# Patient Record
Sex: Female | Born: 1960 | Hispanic: No | Marital: Married
Health system: Southern US, Community
[De-identification: ages and names within clinical notes are randomized; demographics above are authoritative.]

---

## 2011-05-15 ENCOUNTER — Emergency Department (HOSPITAL_COMMUNITY): Payer: Managed Care, Other (non HMO)

## 2011-05-15 ENCOUNTER — Emergency Department (HOSPITAL_COMMUNITY)
Admission: EM | Admit: 2011-05-15 | Discharge: 2011-05-15 | Disposition: A | Discharge status: Home / Self Care | Payer: Managed Care, Other (non HMO) | Attending: Emergency Medicine | Admitting: Emergency Medicine

## 2011-05-15 ENCOUNTER — Encounter (HOSPITAL_COMMUNITY): Payer: Self-pay | Admitting: Emergency Medicine

## 2011-05-15 DIAGNOSIS — R109 Unspecified abdominal pain: Secondary | ICD-10-CM | POA: Insufficient documentation

## 2011-05-15 DIAGNOSIS — R10814 Left lower quadrant abdominal tenderness: Secondary | ICD-10-CM | POA: Insufficient documentation

## 2011-05-15 LAB — URINALYSIS, ROUTINE W REFLEX MICROSCOPIC
Glucose, UA: NEGATIVE mg/dL
Nitrite: NEGATIVE
Specific Gravity, Urine: 1.022 (ref 1.005–1.030)
pH: 5 (ref 5.0–8.0)

## 2011-05-15 LAB — URINE MICROSCOPIC-ADD ON

## 2011-05-15 MED ORDER — HYDROCODONE-ACETAMINOPHEN 5-500 MG PO TABS: 1.0000 | ORAL_TABLET | Freq: Four times a day (QID) | ORAL | Status: AC | PRN

## 2011-05-15 MED ORDER — ONDANSETRON HCL 4 MG/2ML IJ SOLN
4.0000 mg | Freq: Once | INTRAMUSCULAR | Status: AC
  Administered 2011-05-15: 4 mg via INTRAVENOUS
  Filled 2011-05-15: qty 2

## 2011-05-15 MED ORDER — HYDROMORPHONE HCL PF 1 MG/ML IJ SOLN
1.0000 mg | Freq: Once | INTRAMUSCULAR | Status: AC
  Administered 2011-05-15: 1 mg via INTRAVENOUS
  Filled 2011-05-15: qty 1

## 2011-05-15 MED ORDER — OXYCODONE-ACETAMINOPHEN 5-325 MG PO TABS: 1.0000 | ORAL_TABLET | ORAL | Status: DC | PRN

## 2011-05-15 MED ORDER — KETOROLAC TROMETHAMINE 30 MG/ML IJ SOLN
30.0000 mg | Freq: Once | INTRAMUSCULAR | Status: AC
  Administered 2011-05-15: 30 mg via INTRAVENOUS
  Filled 2011-05-15: qty 1

## 2011-05-15 MED ORDER — SODIUM CHLORIDE 0.9 % IV BOLUS (SEPSIS)
1000.0000 mL | Freq: Once | INTRAVENOUS | Status: AC
  Administered 2011-05-15: 1000 mL via INTRAVENOUS

## 2011-05-15 MED ORDER — PROMETHAZINE HCL 25 MG PO TABS: 25.0000 mg | ORAL_TABLET | Freq: Four times a day (QID) | ORAL | Status: AC | PRN

## 2011-05-15 NOTE — ED Notes (Signed)
Pt states pain is starting to come back. md notified.

## 2011-05-15 NOTE — ED Provider Notes (Signed)
History     CSN: [id number]  Arrival date & time 05/15/11  0125   First MD Initiated Contact with Patient 05/15/11 0131      Chief Complaint  Patient presents with  . Abdominal Pain    (Consider location/radiation/quality/duration/timing/severity/associated sxs/prior treatment) Patient is a 51 y.o. female presenting with abdominal pain. The history is provided by the patient.  Abdominal Pain The primary symptoms of the illness include abdominal pain.   patient reports development of acute onset left lower quadrant abdominal pain this evening.  She denies either.  She denies vomiting.  She denies diarrhea.  She denies melena or hematochezia.  She reports she feels like the pain is coming from her left ovary.  She does have a history of cysts.  She's recently been evaluated by the OB/GYN for dysfunctional uterine bleeding and is currently on OCPs.  She denies fevers and chills.  She reports approximately 12-24 hours of urinary frequency without dysuria.  She denies hematuria.  She reports difficulty finding a comfortable position.  She does report the pain radiates down her left thigh as well.  History reviewed. No pertinent past medical history.  History reviewed. No pertinent past surgical history.  History reviewed. No pertinent family history.  History  Substance Use Topics  . Smoking status: Not on file  . Smokeless tobacco: Not on file  . Alcohol Use: Not on file    OB History    Grav Para Term Preterm Abortions TAB SAB Ect Mult Living                  Review of Systems  Gastrointestinal: Positive for abdominal pain.  All other systems reviewed and are negative.    Allergies  Review of patient's allergies indicates not on file.  Home Medications  No current outpatient prescriptions on file.  BP 112/53  Pulse 73  Temp(Src) 98 F (36.7 C) (Oral)  Resp 18  SpO2 99%  Physical Exam  Nursing note and vitals reviewed. Constitutional: She is oriented to person,  place, and time. She appears well-developed and well-nourished. No distress.       Uncomfortable appearing  HENT:  Head: Normocephalic and atraumatic.  Eyes: EOM are normal.  Neck: Normal range of motion.  Cardiovascular: Normal rate, regular rhythm and normal heart sounds.   Pulmonary/Chest: Effort normal and breath sounds normal.  Abdominal: Soft. She exhibits no distension.       Mild tenderness in LLQ without guarding or rebound  Musculoskeletal: Normal range of motion.  Neurological: She is alert and oriented to person, place, and time.  Skin: Skin is warm and dry.  Psychiatric: She has a normal mood and affect. Judgment normal.    ED Course  Procedures (including critical care time)  Labs Reviewed  URINALYSIS, ROUTINE W REFLEX MICROSCOPIC - Abnormal; Notable for the following:    Hgb urine dipstick LARGE (*)    Ketones, ur TRACE (*)    Leukocytes, UA SMALL (*)    All other components within normal limits  URINE MICROSCOPIC-ADD ON - Abnormal; Notable for the following:    Bacteria, UA FEW (*)    All other components within normal limits   Ct Abdomen Pelvis Wo Contrast  05/15/2011  *RADIOLOGY REPORT*  Clinical Data: Lower abdominal pain.  CT ABDOMEN AND PELVIS WITHOUT CONTRAST  Technique:  Multidetector CT imaging of the abdomen and pelvis was performed following the standard protocol without intravenous contrast.  Comparison: None.  Findings: The lung bases are clear.  Bilateral breast implants.  The kidneys appear symmetrical in size and shape.  No pyelocaliectasis or ureterectasis.  No renal, ureteral, or bladder stones are visualized.  The bladder is decompressed and cannot be evaluated for wall thickness.  The unenhanced appearance of the liver, spleen, gallbladder, pancreas, adrenal glands, abdominal aorta, and retroperitoneal lymph nodes is unremarkable.  Stomach and small bowel are decompressed.  Stool filled colon without wall thickening or distension.  No free air or free  fluid in the abdomen.  Pelvis:  The uterus and adnexal structures are not enlarged.  No free or loculated pelvic fluid collections.  No inflammatory changes in the sigmoid colon.  No significant pelvic lymphadenopathy.  The appendix is normal.  Normal alignment of the lumbar vertebrae.  IMPRESSION: No renal or ureteral stone or obstruction demonstrated.  Original Report Authenticated By: Marlon Pel, M.D.   I personally reviewed the CT scan  1. Abdominal pain       MDM  I suspect this may represent another recently passed left ureteral stone.  She has hematuria.  Her symptoms were consistent with this.  She feels much better at this time.  We'll discharge home with close followup with her primary care Dr.        Lyanne Co, MD 05/15/11 [phone]

## 2011-05-15 NOTE — ED Notes (Signed)
[id number]<YQ> Expected date:<BR> Expected time:<BR> Means of arrival:<BR> Comments:<BR> EMS/50 yo female/abd pain

## 2011-05-15 NOTE — ED Notes (Signed)
Per ems: pt comes from daughter's home with c/o lower abdominal pain that began about 2-3 hours ago. Pt states she's had menstrual bleeding x4 days.

## 2012-10-02 IMAGING — CT CT ABD-PELV W/O CM
1 series · 13 of 16 positions shown, 19 images · non-contrast
Comparison: None.

CLINICAL DATA: Lower abdominal pain.

CT ABDOMEN AND PELVIS WITHOUT CONTRAST
TECHNIQUE: Multidetector CT imaging of the abdomen and pelvis was
performed following the standard protocol without intravenous
contrast.

[Series 4: lung · axial · 0.69mm/px · z∈[+1534,+1598]mm · 13 of 16 slices shown, 19 images]
[im 2/16  soft-tissue]
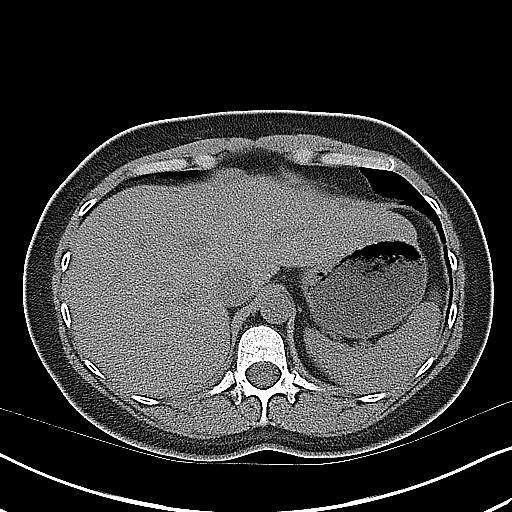
[im 2/16  bone]
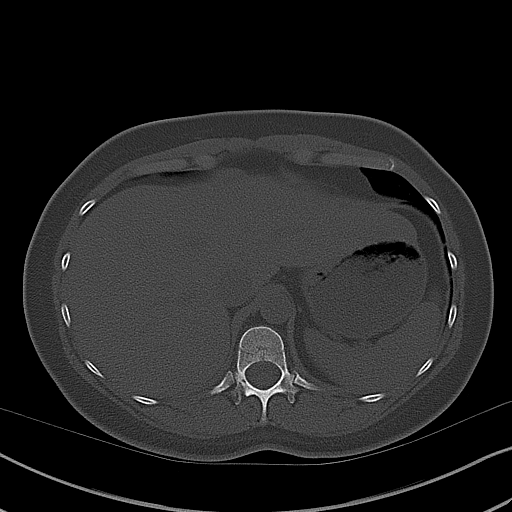
[im 3/16  soft-tissue]
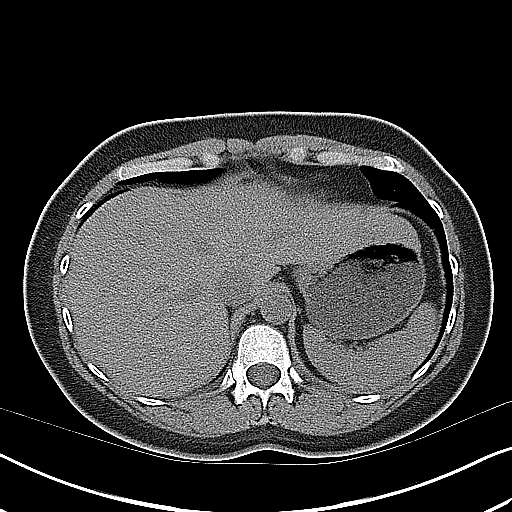
[im 4/16  soft-tissue]
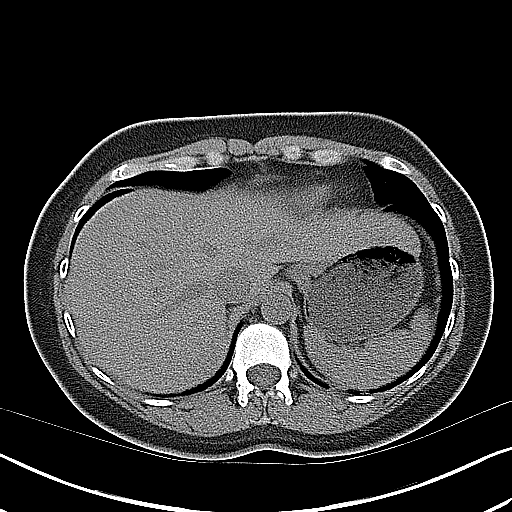
[im 5/16  soft-tissue]
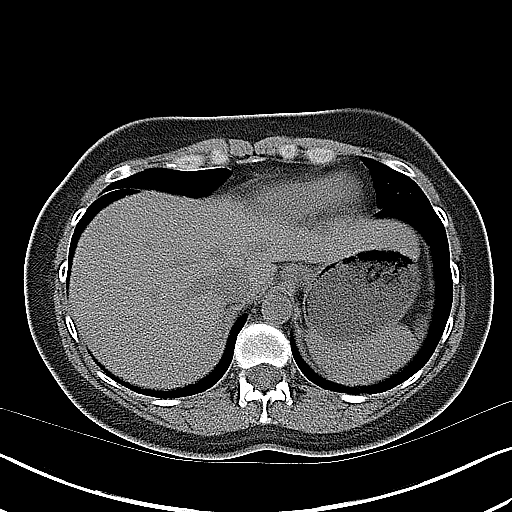
[im 6/16  soft-tissue]
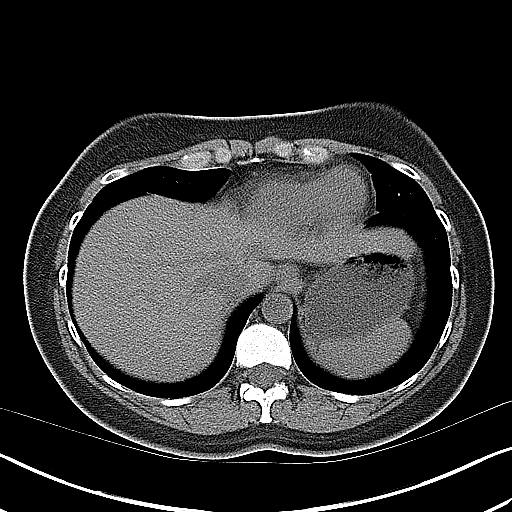
[im 7/16  soft-tissue]
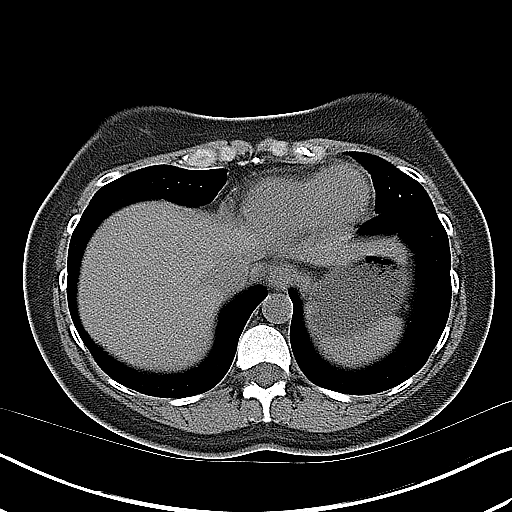
[im 9/16  soft-tissue]
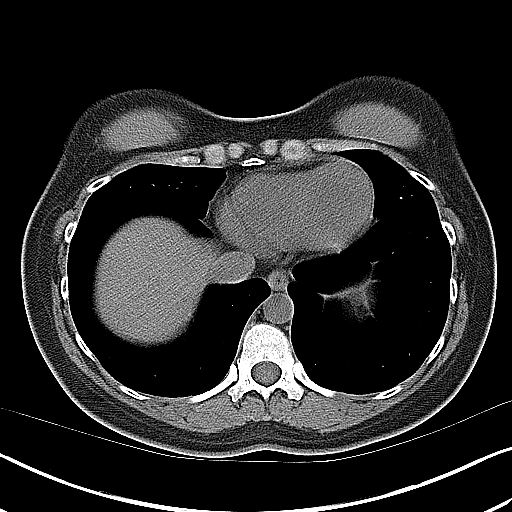
[im 10/16  soft-tissue]
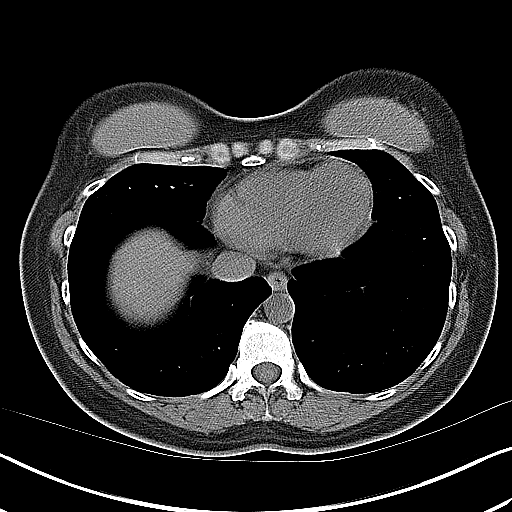
[im 11/16  soft-tissue]
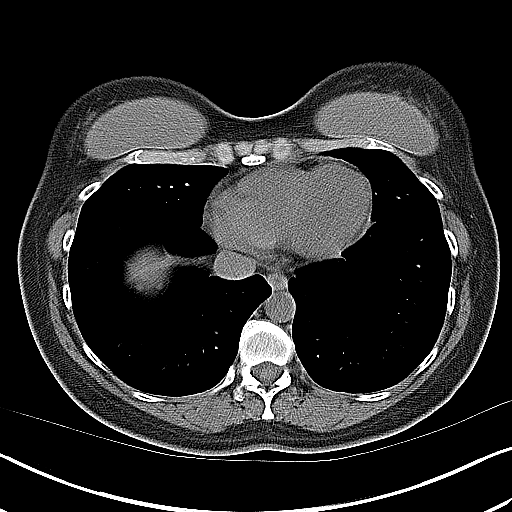
[im 11/16  bone]
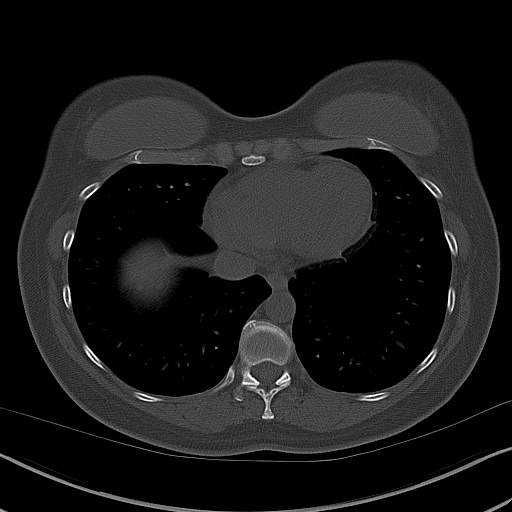
[im 12/16  soft-tissue]
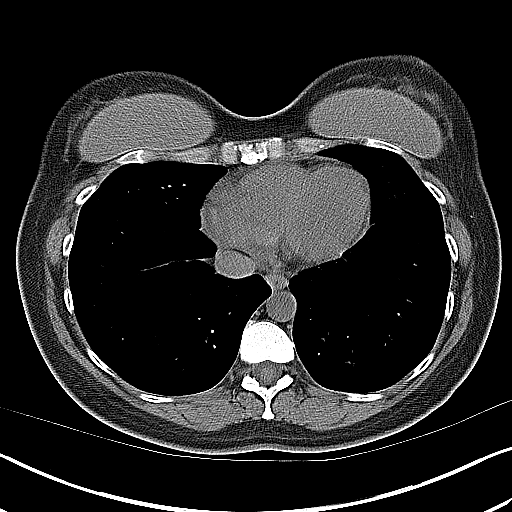
[im 12/16  lung]
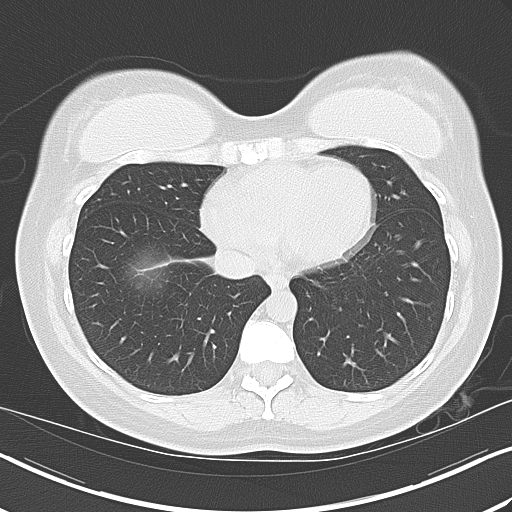
[im 13/16  soft-tissue]
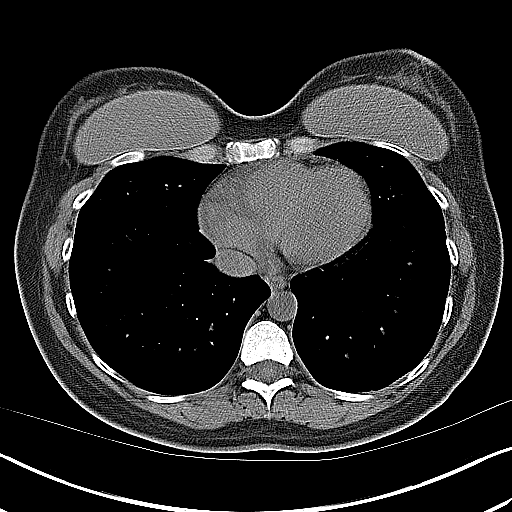
[im 13/16  lung]
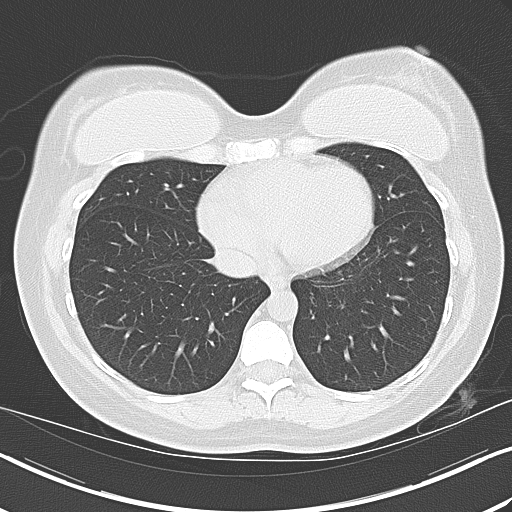
[im 14/16  soft-tissue]
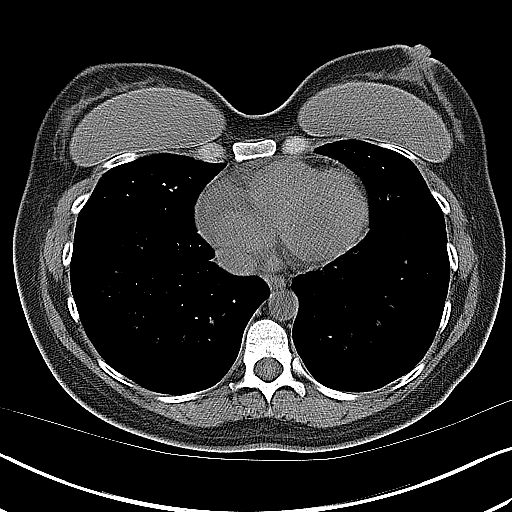
[im 14/16  lung]
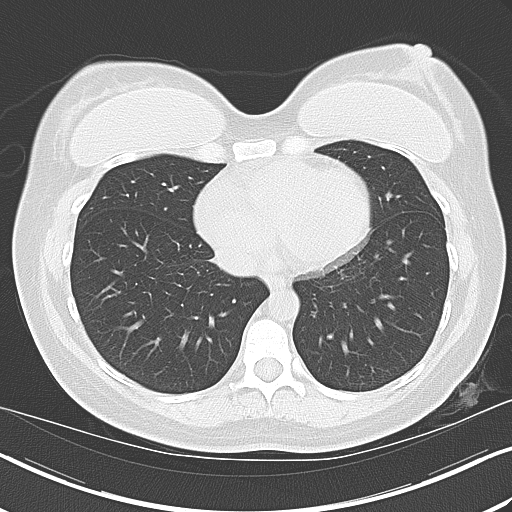
[im 15/16  soft-tissue]
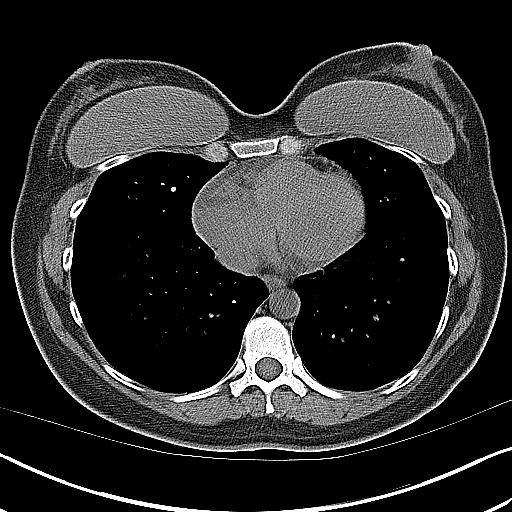
[im 15/16  lung]
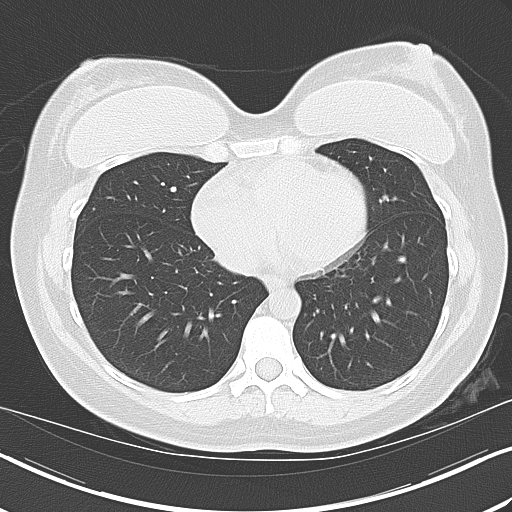

[13 of 16 positions shown; findings below may reference images not displayed]

FINDINGS: The lung bases are clear.  Bilateral breast implants.

The kidneys appear symmetrical in size and shape.  No
pyelocaliectasis or ureterectasis.  No renal, ureteral, or bladder
stones are visualized.  The bladder is decompressed and cannot be
evaluated for wall thickness.

The unenhanced appearance of the liver, spleen, gallbladder,
pancreas, adrenal glands, abdominal aorta, and retroperitoneal
lymph nodes is unremarkable.  Stomach and small bowel are
decompressed.  Stool filled colon without wall thickening or
distension.  No free air or free fluid in the abdomen.

Pelvis:  The uterus and adnexal structures are not enlarged.  No
free or loculated pelvic fluid collections.  No inflammatory
changes in the sigmoid colon.  No significant pelvic
lymphadenopathy.  The appendix is normal.  Normal alignment of the
lumbar vertebrae.
IMPRESSION: No renal or ureteral stone or obstruction demonstrated.

## 2020-02-02 ENCOUNTER — Emergency Department (HOSPITAL_COMMUNITY): Payer: No Typology Code available for payment source

## 2020-02-02 ENCOUNTER — Emergency Department (HOSPITAL_COMMUNITY)
Admission: EM | Admit: 2020-02-02 | Discharge: 2020-02-02 | Disposition: A | Discharge status: Home / Self Care | Payer: No Typology Code available for payment source | Attending: Emergency Medicine | Admitting: Emergency Medicine

## 2020-02-02 ENCOUNTER — Encounter (HOSPITAL_COMMUNITY): Payer: Self-pay

## 2020-02-02 DIAGNOSIS — R1110 Vomiting, unspecified: Secondary | ICD-10-CM | POA: Insufficient documentation

## 2020-02-02 DIAGNOSIS — R1111 Vomiting without nausea: Secondary | ICD-10-CM

## 2020-02-02 DIAGNOSIS — R197 Diarrhea, unspecified: Secondary | ICD-10-CM | POA: Diagnosis present

## 2020-02-02 LAB — URINALYSIS, ROUTINE W REFLEX MICROSCOPIC
Bilirubin Urine: NEGATIVE
Glucose, UA: NEGATIVE mg/dL
Hgb urine dipstick: NEGATIVE
Ketones, ur: 80 mg/dL — AB
Leukocytes,Ua: NEGATIVE
Nitrite: NEGATIVE
Protein, ur: 100 mg/dL — AB
Specific Gravity, Urine: 1.03 (ref 1.005–1.030)
pH: 5 (ref 5.0–8.0)

## 2020-02-02 LAB — TROPONIN I (HIGH SENSITIVITY): Troponin I (High Sensitivity): <2 ng/L (ref <18)

## 2020-02-02 LAB — BASIC METABOLIC PANEL
Anion gap: 16 — ABNORMAL HIGH (ref 5–15)
BUN: 18 mg/dL (ref 6–20)
CO2: 20 mmol/L — ABNORMAL LOW (ref 22–32)
Calcium: 9.2 mg/dL (ref 8.9–10.3)
Chloride: 102 mmol/L (ref 98–111)
Creatinine, Ser: 0.73 mg/dL (ref 0.44–1.00)
GFR, Estimated: >60 mL/min (ref >60)
Glucose, Bld: 144 mg/dL — ABNORMAL HIGH (ref 70–99)
Potassium: 3.8 mmol/L (ref 3.5–5.1)
Sodium: 138 mmol/L (ref 135–145)

## 2020-02-02 LAB — CBC
HCT: 45.2 % (ref 36.0–46.0)
Hemoglobin: 15.3 g/dL — ABNORMAL HIGH (ref 12.0–15.0)
MCH: 30.5 pg (ref 26.0–34.0)
MCHC: 33.8 g/dL (ref 30.0–36.0)
MCV: 90 fL (ref 80.0–100.0)
Platelets: 225 10*3/uL (ref 150–400)
RBC: 5.02 MIL/uL (ref 3.87–5.11)
RDW: 13 % (ref 11.5–15.5)
WBC: 11.7 10*3/uL — ABNORMAL HIGH (ref 4.0–10.5)
nRBC: 0 % (ref 0.0–0.2)

## 2020-02-02 LAB — CBG MONITORING, ED: Glucose-Capillary: 160 mg/dL — ABNORMAL HIGH (ref 70–99)

## 2020-02-02 LAB — LIPASE, BLOOD: Lipase: 25 U/L (ref 11–51)

## 2020-02-02 MED ORDER — ONDANSETRON HCL 4 MG/2ML IJ SOLN
4.0000 mg | Freq: Once | INTRAMUSCULAR | Status: AC
  Administered 2020-02-02: 4 mg via INTRAVENOUS
  Filled 2020-02-02: qty 2

## 2020-02-02 MED ORDER — SODIUM CHLORIDE 0.9 % IV BOLUS
1000.0000 mL | Freq: Once | INTRAVENOUS | Status: AC
  Administered 2020-02-02: 1000 mL via INTRAVENOUS

## 2020-02-02 MED ORDER — ONDANSETRON HCL 4 MG PO TABS: 4.0000 mg | ORAL_TABLET | Freq: Three times a day (TID) | ORAL | 0 refills | Status: AC | PRN

## 2020-02-02 NOTE — ED Provider Notes (Signed)
[facility]-EMERGENCY DEPT Provider Note   CSN: [id number] Arrival date & time: 02/02/20  0602     History Chief Complaint  Patient presents with  . Diarrhea  . Emesis    Jean Martinez is a 59 y.o. female.  The history is provided by the patient.  Emesis Severity:  Mild Timing:  Intermittent Progression:  Unchanged Chronicity:  New Recent urination:  Normal Relieved by:  Nothing Worsened by:  Nothing Associated symptoms: diarrhea   Associated symptoms: no abdominal pain, no arthralgias, no chills, no cough, no fever and no sore throat   Risk factors: sick contacts (daughter with food poisioning, but doesnt think she ate same food)        History reviewed. No pertinent past medical history.  There are no problems to display for this patient.   History reviewed. No pertinent surgical history.   OB History   No obstetric history on file.     No family history on file.  Social History   Tobacco Use  . Smoking status: Not on file  Substance Use Topics  . Alcohol use: Not on file  . Drug use: Not on file    Home Medications Prior to Admission medications   Medication Sig Start Date End Date Taking? Authorizing Provider  ondansetron (ZOFRAN) 4 MG tablet Take 1 tablet (4 mg total) by mouth every 8 (eight) hours as needed for up to 20 doses for nausea or vomiting. 02/02/20   Virgina Norfolk, DO    Allergies    Patient has no known allergies.  Review of Systems   Review of Systems  Constitutional: Negative for chills and fever.  HENT: Negative for ear pain and sore throat.   Eyes: Negative for pain and visual disturbance.  Respiratory: Negative for cough and shortness of breath.   Cardiovascular: Positive for palpitations. Negative for chest pain.  Gastrointestinal: Positive for diarrhea and vomiting. Negative for abdominal pain.  Genitourinary: Negative for dysuria and hematuria.  Musculoskeletal: Negative for arthralgias and back  pain.  Skin: Negative for color change and rash.  Neurological: Positive for light-headedness. Negative for seizures and syncope.  All other systems reviewed and are negative.   Physical Exam Updated Vital Signs  ED Triage Vitals  Enc Vitals Group     BP 02/02/20 0620 116/66     Pulse Rate 02/02/20 0620 100     Resp 02/02/20 0620 (!) 22     Temp 02/02/20 0620 98.3 F (36.8 C)     Temp Source 02/02/20 0620 Oral     SpO2 02/02/20 0620 96 %     Weight --      Height --      Head Circumference --      Peak Flow --      Pain Score 02/02/20 0617 2     Pain Loc --      Pain Edu? --      Excl. in GC? --     Physical Exam Vitals and nursing note reviewed.  Constitutional:      General: She is not in acute distress.    Appearance: She is well-developed. She is not ill-appearing.  HENT:     Head: Normocephalic and atraumatic.     Nose: Nose normal.     Mouth/Throat:     Mouth: Mucous membranes are moist.  Eyes:     Extraocular Movements: Extraocular movements intact.     Conjunctiva/sclera: Conjunctivae normal.     Pupils: Pupils are  equal, round, and reactive to light.  Cardiovascular:     Rate and Rhythm: Normal rate and regular rhythm.     Pulses: Normal pulses.     Heart sounds: Normal heart sounds. No murmur heard.   Pulmonary:     Effort: Pulmonary effort is normal. No respiratory distress.     Breath sounds: Normal breath sounds.  Abdominal:     Palpations: Abdomen is soft.     Tenderness: There is no abdominal tenderness.  Musculoskeletal:        General: Normal range of motion.     Cervical back: Neck supple.     Right lower leg: No edema.     Left lower leg: No edema.  Skin:    General: Skin is warm and dry.     Capillary Refill: Capillary refill takes less than 2 seconds.  Neurological:     General: No focal deficit present.     Mental Status: She is alert.  Psychiatric:        Mood and Affect: Mood normal.     ED Results / Procedures / Treatments    Labs (all labs ordered are listed, but only abnormal results are displayed) Labs Reviewed  BASIC METABOLIC PANEL - Abnormal; Notable for the following components:      Result Value   CO2 20 (*)    Glucose, Bld 144 (*)    Anion gap 16 (*)    All other components within normal limits  CBC - Abnormal; Notable for the following components:   WBC 11.7 (*)    Hemoglobin 15.3 (*)    All other components within normal limits  URINALYSIS, ROUTINE W REFLEX MICROSCOPIC - Abnormal; Notable for the following components:   APPearance HAZY (*)    Ketones, ur 80 (*)    Protein, ur 100 (*)    Bacteria, UA RARE (*)    All other components within normal limits  CBG MONITORING, ED - Abnormal; Notable for the following components:   Glucose-Capillary 160 (*)    All other components within normal limits  URINE CULTURE  LIPASE, BLOOD  CBG MONITORING, ED  TROPONIN I (HIGH SENSITIVITY)    EKG EKG Interpretation  Date/Time:  Thursday February 02 2020 06:23:50 EDT Ventricular Rate:  96 PR Interval:    QRS Duration: 86 QT Interval:  345 QTC Calculation: 436 R Axis:   89 Text Interpretation: Sinus rhythm Right atrial enlargement No previous ECGs available Confirmed by Paula Libra ([id number]) on 02/02/2020 6:27:03 AM   Radiology DG Chest 2 View  Result Date: 02/02/2020 CLINICAL DATA:  Chest pain. EXAM: CHEST - 2 VIEW COMPARISON:  No prior. FINDINGS: Mediastinum hilar structures normal. Lungs are clear. No pleural effusion or pneumothorax. Biapical pleural thickening consistent scarring. Heart size normal. No acute bony abnormality. IMPRESSION: No acute cardiopulmonary disease. Electronically Signed   By: Maisie Fus  Register   On: 02/02/2020 07:32    Procedures Procedures (including critical care time)  Medications Ordered in ED Medications  sodium chloride 0.9 % bolus 1,000 mL (1,000 mLs Intravenous New Bag/Given 02/02/20 0837)  ondansetron (ZOFRAN) injection 4 mg (4 mg Intravenous Given 02/02/20  5329)    ED Course  I have reviewed the triage vital signs and the nursing notes.  Pertinent labs & imaging results that were available during my care of the patient were reviewed by me and considered in my medical decision making (see chart for details).    MDM Rules/Calculators/A&P  Jean Martinez is a 59 year old female with no significant medical history presents the ED with nausea, vomiting, diarrhea.  Normal vitals.  No fever.  Has felt rundown since symptoms started yesterday.  Feels dehydrated.  Denies any suspicious food intake however daughter has been sick with similar symptoms.  Will test for Covid.  Lab work has already been done prior to my evaluation is unremarkable.  No significant anemia, electrolyte abnormality, kidney injury.  Chest x-ray was done that showed no infection.  EKG shows sinus rhythm.  She is not having any abdominal pain, no shortness of breath or chest pain.  Overall suspect GI related illness likely viral or foodborne.  Will give IV fluid hydration and Zofran and anticipate discharge home with Zofran.  No abdominal tenderness on exam no concern for cholecystitis or other intra-abdominal process.  No urinary tract infection.  Overall patient felt better after IV fluids and Zofran.  Given reassurance.  Recommend continued use of Zofran at home.  This chart was dictated using voice recognition software.  Despite best efforts to proofread,  errors can occur which can change the documentation meaning.    Final Clinical Impression(s) / ED Diagnoses Final diagnoses:  Vomiting without nausea, intractability of vomiting not specified, unspecified vomiting type    Rx / DC Orders ED Discharge Orders         Ordered    ondansetron (ZOFRAN) 4 MG tablet  Every 8 hours PRN        02/02/20 0924           Virgina Norfolk, DO 02/02/20 [phone]

## 2020-02-02 NOTE — ED Triage Notes (Signed)
Patient states throughout the day yesterday she had palpitations/right sided chest pain. Reports NVD last night and headache.

## 2020-02-02 NOTE — ED Notes (Addendum)
Patient stated "I feel like I am going to pass out" Patient had a syncopal episode lasting roughly 30-45 seconds in the triage chair and began vomiting and gagging. Patient assisted patient over to left side until became responsive again.

## 2020-02-03 LAB — URINE CULTURE

## 2021-06-22 IMAGING — CR DG CHEST 2V
2 series · 2 of 2 positions shown · non-contrast
Comparison: No prior.

CLINICAL DATA: Chest pain.

EXAM:
CHEST - 2 VIEW

[w chest pa]
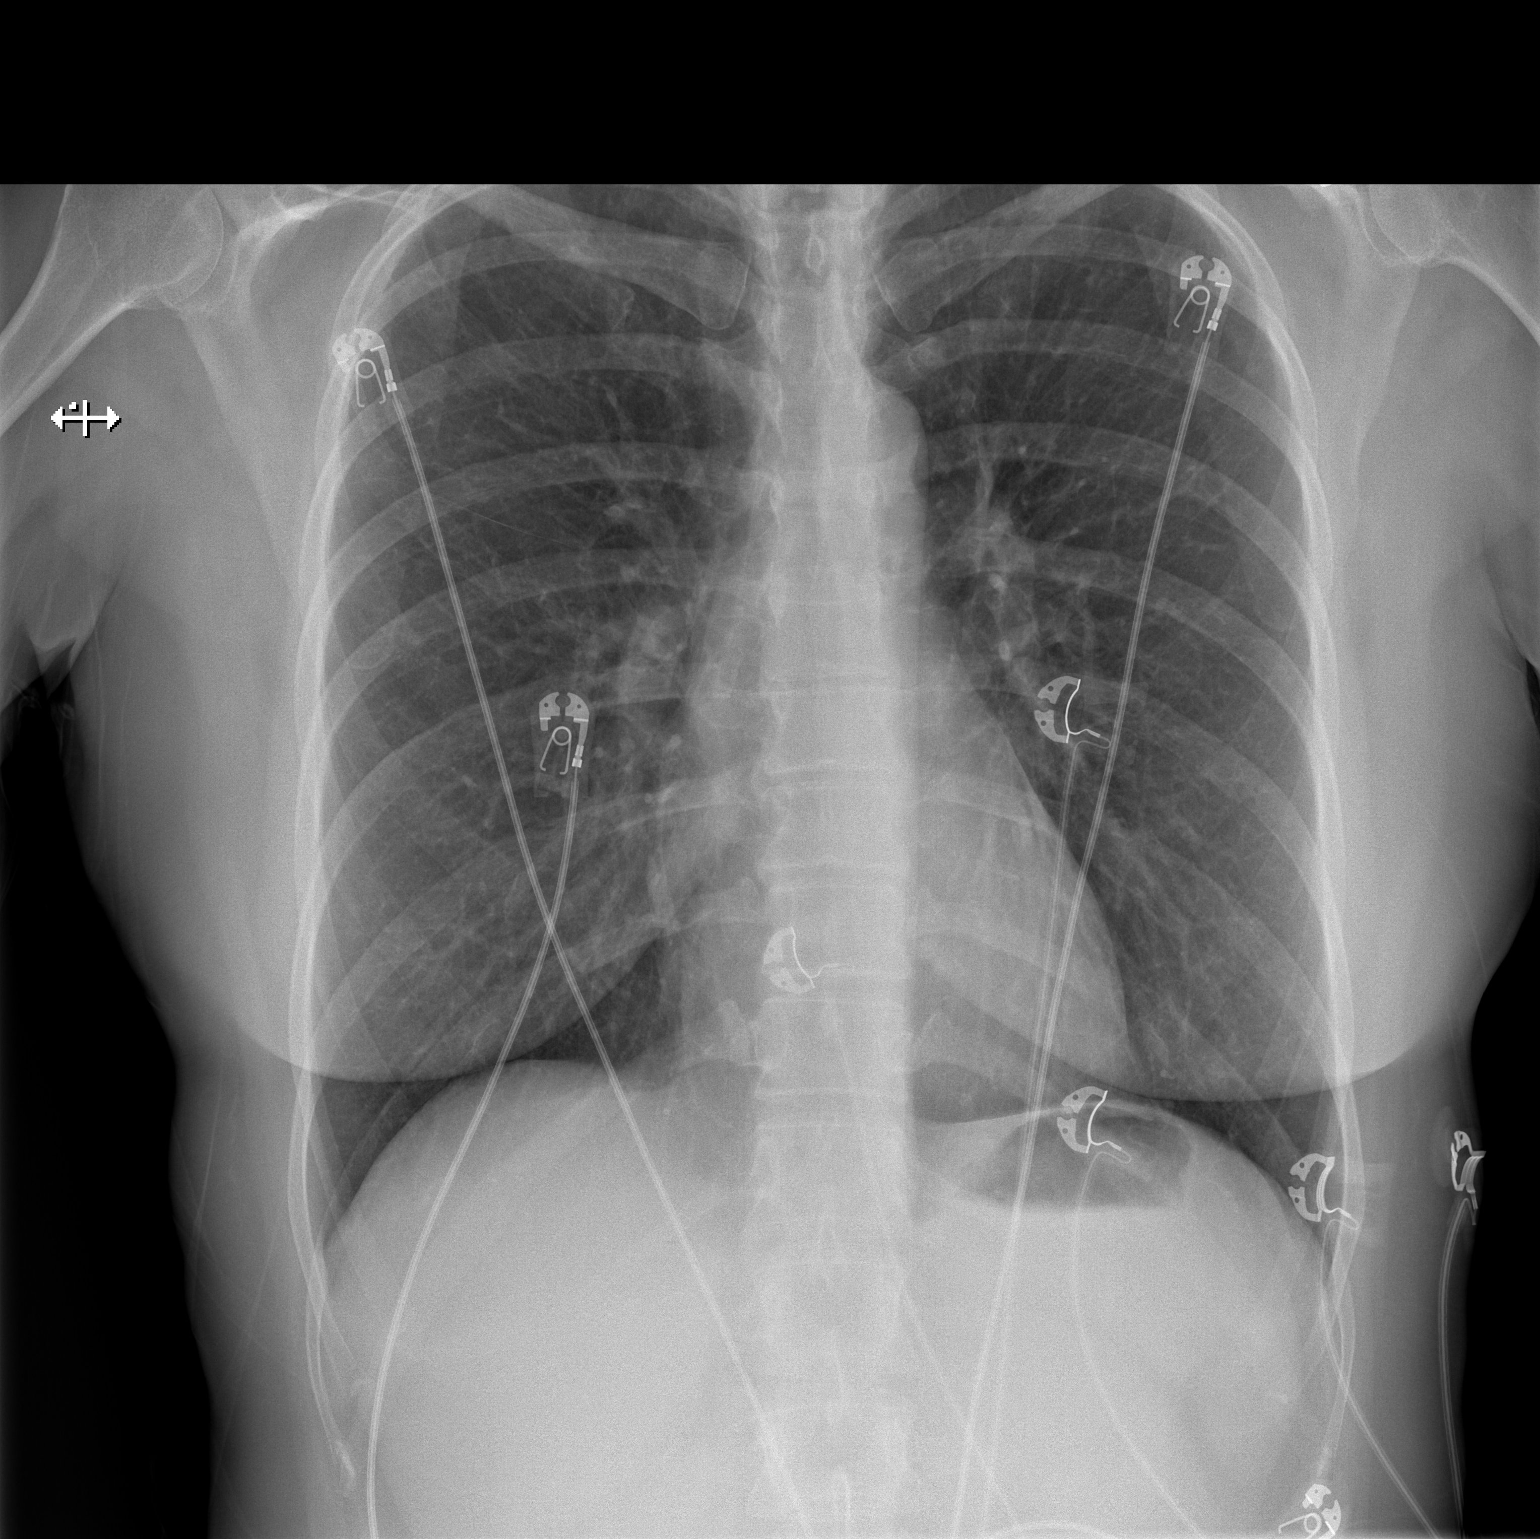

[w chest lat]
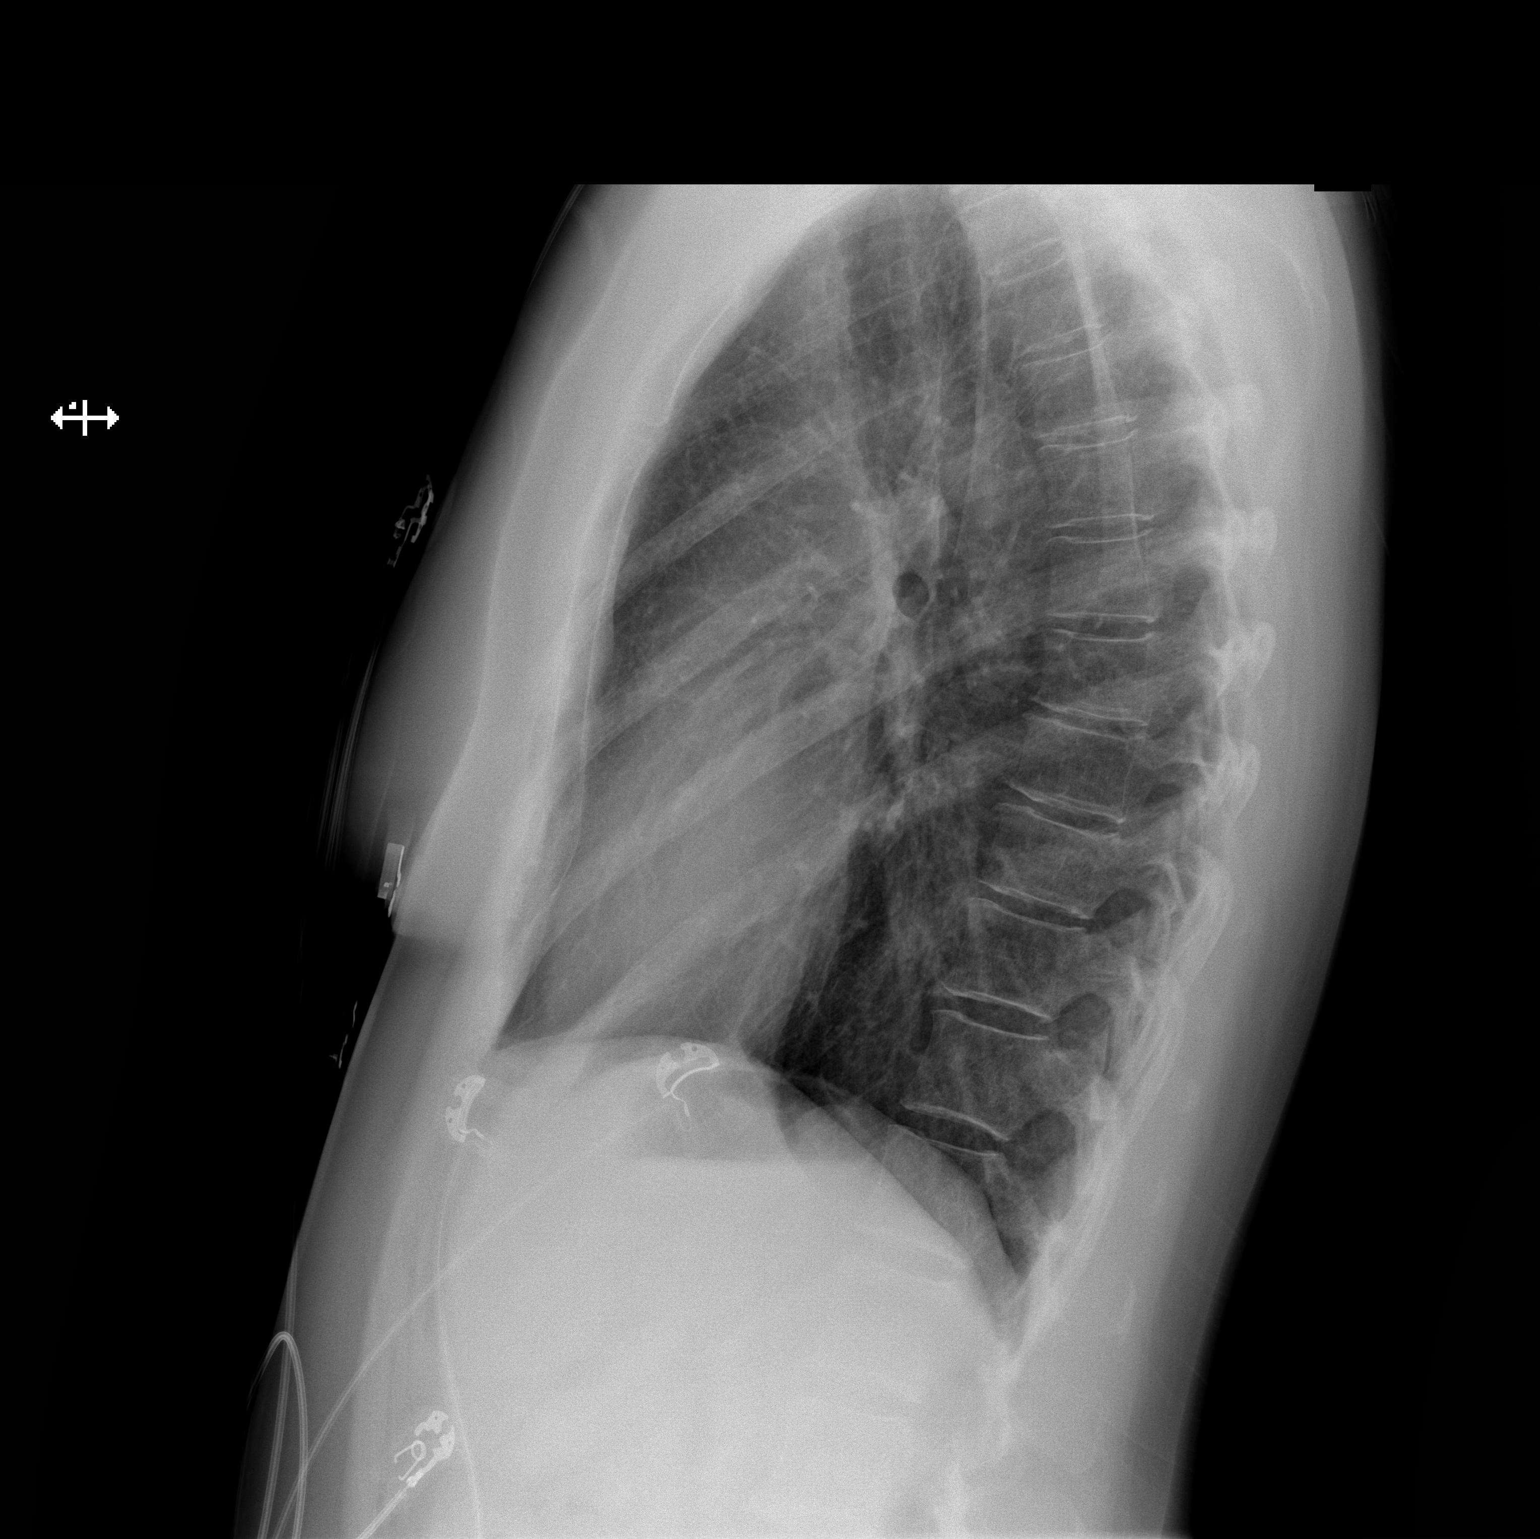

[2 of 2 positions shown; findings below may reference images not displayed]

FINDINGS: Mediastinum hilar structures normal. Lungs are clear. No pleural
effusion or pneumothorax. Biapical pleural thickening consistent
scarring. Heart size normal. No acute bony abnormality.
IMPRESSION: No acute cardiopulmonary disease.
